# Patient Record
Sex: Male | Born: 1995 | Race: Black or African American | Hispanic: No | Marital: Married
Health system: Southern US, Community
[De-identification: ages and names within clinical notes are randomized; demographics above are authoritative.]

---
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
## 2014-11-04 NOTE — Consult Note (Signed)
PATIENT NAME:  Ralph Norton, Jeramiah MR#:  [id number] DATE OF BIRTH:  10/25/1995  DATE OF CONSULTATION:  09/12/2011  REFERRING PHYSICIAN:   CONSULTING PHYSICIAN:  Zackery BarefootJ. Madison Pasha Gadison, MD  HISTORY OF PRESENT ILLNESS: The patient is a 19 year old African American male who was eating chicken and rice last night. He denies that any bone was in the chicken. About an hour after he finished eating he started experiencing some sharp pains in the left postcricoid area. He became very anxious about that and presented to the Emergency Room. They did a complete work-up in the Emergency Room and found no foreign body after a lateral neck x-ray. There were no other abnormalities on his examination.   ALLERGIES: None.   MEDICATIONS: None.   PAST MEDICAL HISTORY: Negative.   PAST SURGICAL HISTORY: Negative.   SOCIAL HISTORY: The patient lives at home. 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   PHYSICAL EXAMINATION:   GENERAL: The patient is healthy appearing. Has no hypersalivation or difficulty handling his secretions.   NECK: Full thyroid lobes bilaterally. No thyroid masses or lesions. No significant tension in the neck.  ORAL CAVITY/OROPHARYNX: Tongue is normal. There are no masses or lesions. Oropharynx is normal. No masses apparent in the oropharynx.   IMPRESSION: Likely cricopharyngeal spasm with an abrasion or superficial laceration. I have recommended that the patient be observed until after he has tolerated a full liquid diet and then we will get him discharged. He will use omeprazole for 10 days. I will plan on seeing him back in two weeks. Of note, he was observed for approximately 6 to 8 hours in the hospital during which time he improved significantly with respect to his swallowing and throat pain.  ____________________________ Shela CommonsJ. Gertie BaronMadison Xiao Graul, MD [email or web address] D: 09/12/2011 09:06:05 ET T: 09/12/2011 09:24:07 ET JOB#: [id number]  cc: Zackery BarefootJ. Madison Leata Dominy, MD, <Dictator> Wendee CoppJMADISON Tariq Pernell  MD ELECTRONICALLY SIGNED 09/14/2011 10:06

## 2024-02-26 LAB — GLUCOSE, POCT (MANUAL RESULT ENTRY): POC Glucose: 143 mg/dL — AB (ref 70–99)

## 2024-02-28 NOTE — Congregational Nurse Program (Signed)
  Dept: [phone]   Congregational Nurse Program Note  Date of Encounter: 02/26/2024  Provided know your numbers handout.  Provided bottle of water.  Past Medical History: No past medical history on file.  Encounter Details:  Community Questionnaire - 02/28/24 0903       Questionnaire   Ask client: Do you give verbal consent for me to treat you today? Yes    Student Assistance N/A    Location Patient Served  N/A    Encounter Setting Other   [employer] back to school event   Population Status Unknown    Insurance Unknown    Insurance/Financial Assistance Referral N/A    Medication N/A    Medical Provider No    Screening Referrals Made N/A    Medical Referrals Made N/A    Medical Appointment Completed N/A    CNP Interventions Advocate/Support;Educate    Screenings CN Performed Blood Pressure;Blood Glucose    ED Visit Averted N/A    Life-Saving Intervention Made N/A          Today's Vitals   02/28/24 0902  BP: 118/73  Pulse: 61   There is no height or weight on file to calculate BMI.
